# Patient Record
Sex: Male | Born: 1995 | Race: White | Hispanic: No | Marital: Single | Smoking: Current some day smoker
Health system: Southern US, Community
[De-identification: ages and names within clinical notes are randomized; demographics above are authoritative.]

## PROBLEM LIST (undated history)

## (undated) HISTORY — PX: NASAL SINUS SURGERY: SHX719

## (undated) HISTORY — PX: TONSILLECTOMY: SUR1361

---
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
## 2016-05-21 ENCOUNTER — Encounter: Payer: Self-pay | Admitting: *Deleted

## 2016-06-05 ENCOUNTER — Ambulatory Visit: Payer: Medicaid Other | Admitting: General Surgery

## 2016-06-19 ENCOUNTER — Encounter: Payer: Self-pay | Admitting: *Deleted

## 2016-07-19 ENCOUNTER — Encounter: Payer: Self-pay | Admitting: General Surgery

## 2016-07-19 NOTE — Progress Notes (Signed)
Patient ID: Peggye FothergillDillon W Rigg, male   DOB: [birth year], 20 y.o.   MRN: [id number]  Chief Complaint  Patient presents with  . Other    pilonidal cyst    HPI Peggye FothergillDillon W Sermeno is a 20 y.o. maleHPI  No past medical history on file.  Past Surgical History:  Procedure Laterality Date  . NASAL SINUS SURGERY    . TONSILLECTOMY      No family history on file.  Social History Social History  Substance Use Topics  . Smoking status: Current Some Day Smoker  . Smokeless tobacco: Current User    Types: Chew  . Alcohol use No    Allergies  Allergen Reactions  . Penicillins     No current outpatient prescriptions on file.   No current facility-administered medications for this visit.     Review of Systems Review of Systems  Constitutional: Negative.   Respiratory: Negative.   Cardiovascular: Negative.     There were no vitals taken for this visit.  Physical Exam Physical Exam          Ples SpecterQualls, Shiro Ellerman 07/19/2016, 3:05 PM    This encounter was created in error - please disregard.

## 2016-09-06 ENCOUNTER — Encounter: Payer: Self-pay | Admitting: *Deleted

## 2020-02-28 ENCOUNTER — Other Ambulatory Visit: Payer: Self-pay

## 2020-02-28 ENCOUNTER — Emergency Department (HOSPITAL_COMMUNITY)
Admission: EM | Admit: 2020-02-28 | Discharge: 2020-02-28 | Disposition: A | Discharge status: Home / Self Care | Payer: Self-pay | Attending: Emergency Medicine | Admitting: Emergency Medicine

## 2020-02-28 ENCOUNTER — Emergency Department (HOSPITAL_COMMUNITY): Payer: Self-pay

## 2020-02-28 ENCOUNTER — Encounter (HOSPITAL_COMMUNITY): Payer: Self-pay | Admitting: Emergency Medicine

## 2020-02-28 DIAGNOSIS — S0181XA Laceration without foreign body of other part of head, initial encounter: Secondary | ICD-10-CM | POA: Insufficient documentation

## 2020-02-28 DIAGNOSIS — S0990XA Unspecified injury of head, initial encounter: Secondary | ICD-10-CM | POA: Insufficient documentation

## 2020-02-28 DIAGNOSIS — Y9289 Other specified places as the place of occurrence of the external cause: Secondary | ICD-10-CM | POA: Insufficient documentation

## 2020-02-28 DIAGNOSIS — Y999 Unspecified external cause status: Secondary | ICD-10-CM | POA: Insufficient documentation

## 2020-02-28 DIAGNOSIS — T148XXA Other injury of unspecified body region, initial encounter: Secondary | ICD-10-CM

## 2020-02-28 DIAGNOSIS — S50311A Abrasion of right elbow, initial encounter: Secondary | ICD-10-CM | POA: Insufficient documentation

## 2020-02-28 DIAGNOSIS — F17220 Nicotine dependence, chewing tobacco, uncomplicated: Secondary | ICD-10-CM | POA: Insufficient documentation

## 2020-02-28 DIAGNOSIS — S80211A Abrasion, right knee, initial encounter: Secondary | ICD-10-CM | POA: Insufficient documentation

## 2020-02-28 DIAGNOSIS — S30811A Abrasion of abdominal wall, initial encounter: Secondary | ICD-10-CM | POA: Insufficient documentation

## 2020-02-28 DIAGNOSIS — Y9389 Activity, other specified: Secondary | ICD-10-CM | POA: Insufficient documentation

## 2020-02-28 MED ORDER — LIDOCAINE-EPINEPHRINE (PF) 1 %-1:200000 IJ SOLN
20.0000 mL | Freq: Once | INTRAMUSCULAR | Status: AC
  Administered 2020-02-28: 20 mL
  Filled 2020-02-28: qty 30

## 2020-02-28 MED ORDER — NAPROXEN 500 MG PO TABS: 500.0000 mg | ORAL_TABLET | Freq: Two times a day (BID) | ORAL | 0 refills | Status: AC

## 2020-02-28 MED ORDER — MORPHINE SULFATE (PF) 4 MG/ML IV SOLN
4.0000 mg | Freq: Once | INTRAVENOUS | Status: AC
  Administered 2020-02-28: 4 mg via INTRAVENOUS
  Filled 2020-02-28: qty 1

## 2020-02-28 MED ORDER — TETANUS-DIPHTH-ACELL PERTUSSIS 5-2.5-18.5 LF-MCG/0.5 IM SUSP
0.5000 mL | Freq: Once | INTRAMUSCULAR | Status: AC
  Administered 2020-02-28: 0.5 mL via INTRAMUSCULAR
  Filled 2020-02-28: qty 0.5

## 2020-02-28 MED ORDER — ONDANSETRON HCL 4 MG/2ML IJ SOLN
4.0000 mg | Freq: Once | INTRAMUSCULAR | Status: AC
  Administered 2020-02-28: 4 mg via INTRAVENOUS
  Filled 2020-02-28: qty 2
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
## 2020-02-28 NOTE — ED Notes (Signed)
Patient transported to CT 
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
